# Patient Record
Sex: Female | Born: 1945 | Race: White | Hispanic: No | Marital: Married
Health system: Southern US, Community
[De-identification: ages and names within clinical notes are randomized; demographics above are authoritative.]

## PROBLEM LIST (undated history)

## (undated) DIAGNOSIS — H3530 Unspecified macular degeneration: Secondary | ICD-10-CM

## (undated) DIAGNOSIS — M069 Rheumatoid arthritis, unspecified: Secondary | ICD-10-CM

## (undated) DIAGNOSIS — E559 Vitamin D deficiency, unspecified: Secondary | ICD-10-CM

## (undated) DIAGNOSIS — I209 Angina pectoris, unspecified: Secondary | ICD-10-CM

## (undated) DIAGNOSIS — E785 Hyperlipidemia, unspecified: Secondary | ICD-10-CM

## (undated) DIAGNOSIS — I739 Peripheral vascular disease, unspecified: Secondary | ICD-10-CM

## (undated) DIAGNOSIS — C801 Malignant (primary) neoplasm, unspecified: Secondary | ICD-10-CM

## (undated) DIAGNOSIS — M1990 Unspecified osteoarthritis, unspecified site: Secondary | ICD-10-CM

## (undated) DIAGNOSIS — G8929 Other chronic pain: Secondary | ICD-10-CM

---
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
## 2007-06-19 ENCOUNTER — Other Ambulatory Visit: Payer: Self-pay

## 2007-06-19 ENCOUNTER — Inpatient Hospital Stay: Payer: Self-pay | Admitting: Internal Medicine

## 2007-06-25 ENCOUNTER — Emergency Department: Payer: Self-pay | Admitting: Emergency Medicine

## 2008-02-21 HISTORY — PX: JOINT REPLACEMENT: SHX530

## 2008-06-09 ENCOUNTER — Encounter: Payer: Self-pay | Admitting: Rheumatology

## 2008-06-20 ENCOUNTER — Encounter: Payer: Self-pay | Admitting: Rheumatology

## 2009-06-07 IMAGING — CR DG CHEST 2V
1 series · 2 of 2 positions shown · non-contrast
Comparison: none

REASON FOR EXAM: Chest pain
COMMENTS:

PROCEDURE:     DXR - DXR CHEST PA (OR AP) AND LATERAL  - June 19, 2007  [DATE]
RESULT:     The lungs are mildly hyperinflated. There is no focal
infiltrate. The heart is not enlarged. The pulmonary vascularity is not
engorged. Mild degenerative disc space narrowing of the thoracic spine is
noted.

[Series 1: view not recorded · 0.17mm/px · 2 of 2 slices shown]
[im 1/2]
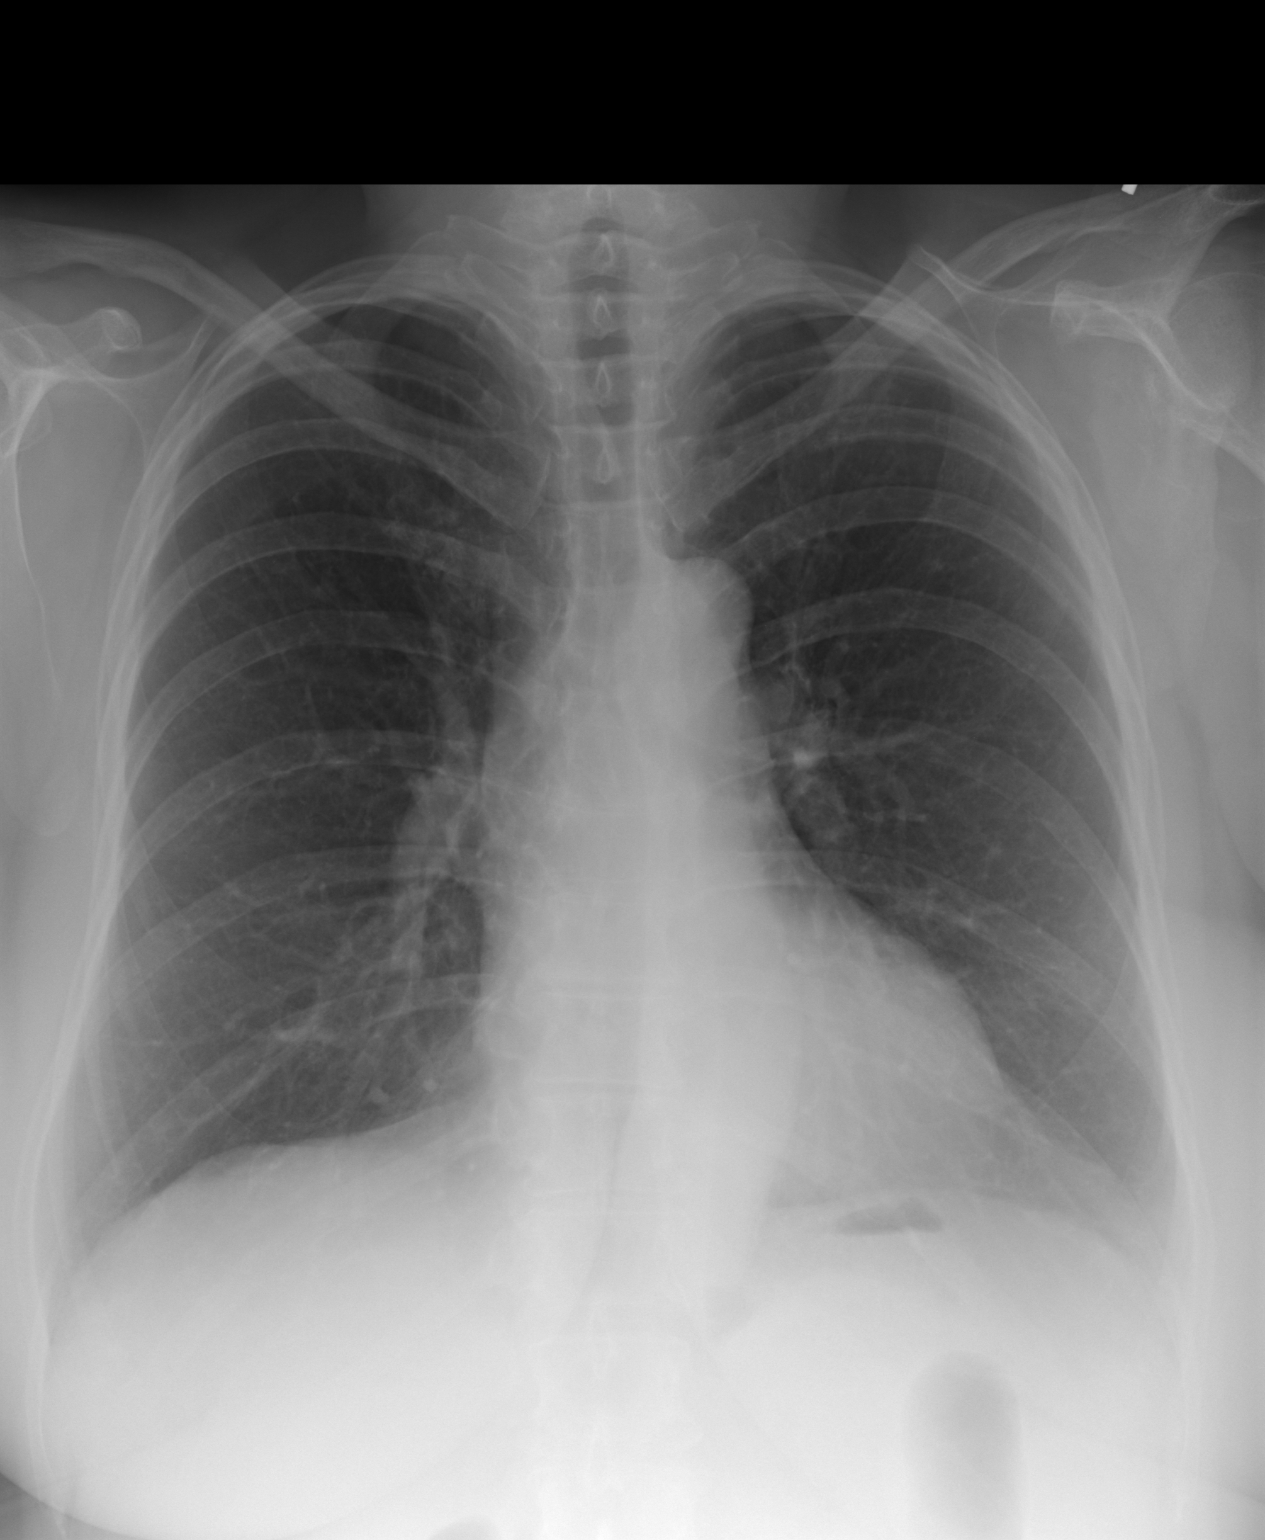
[im 2/2]
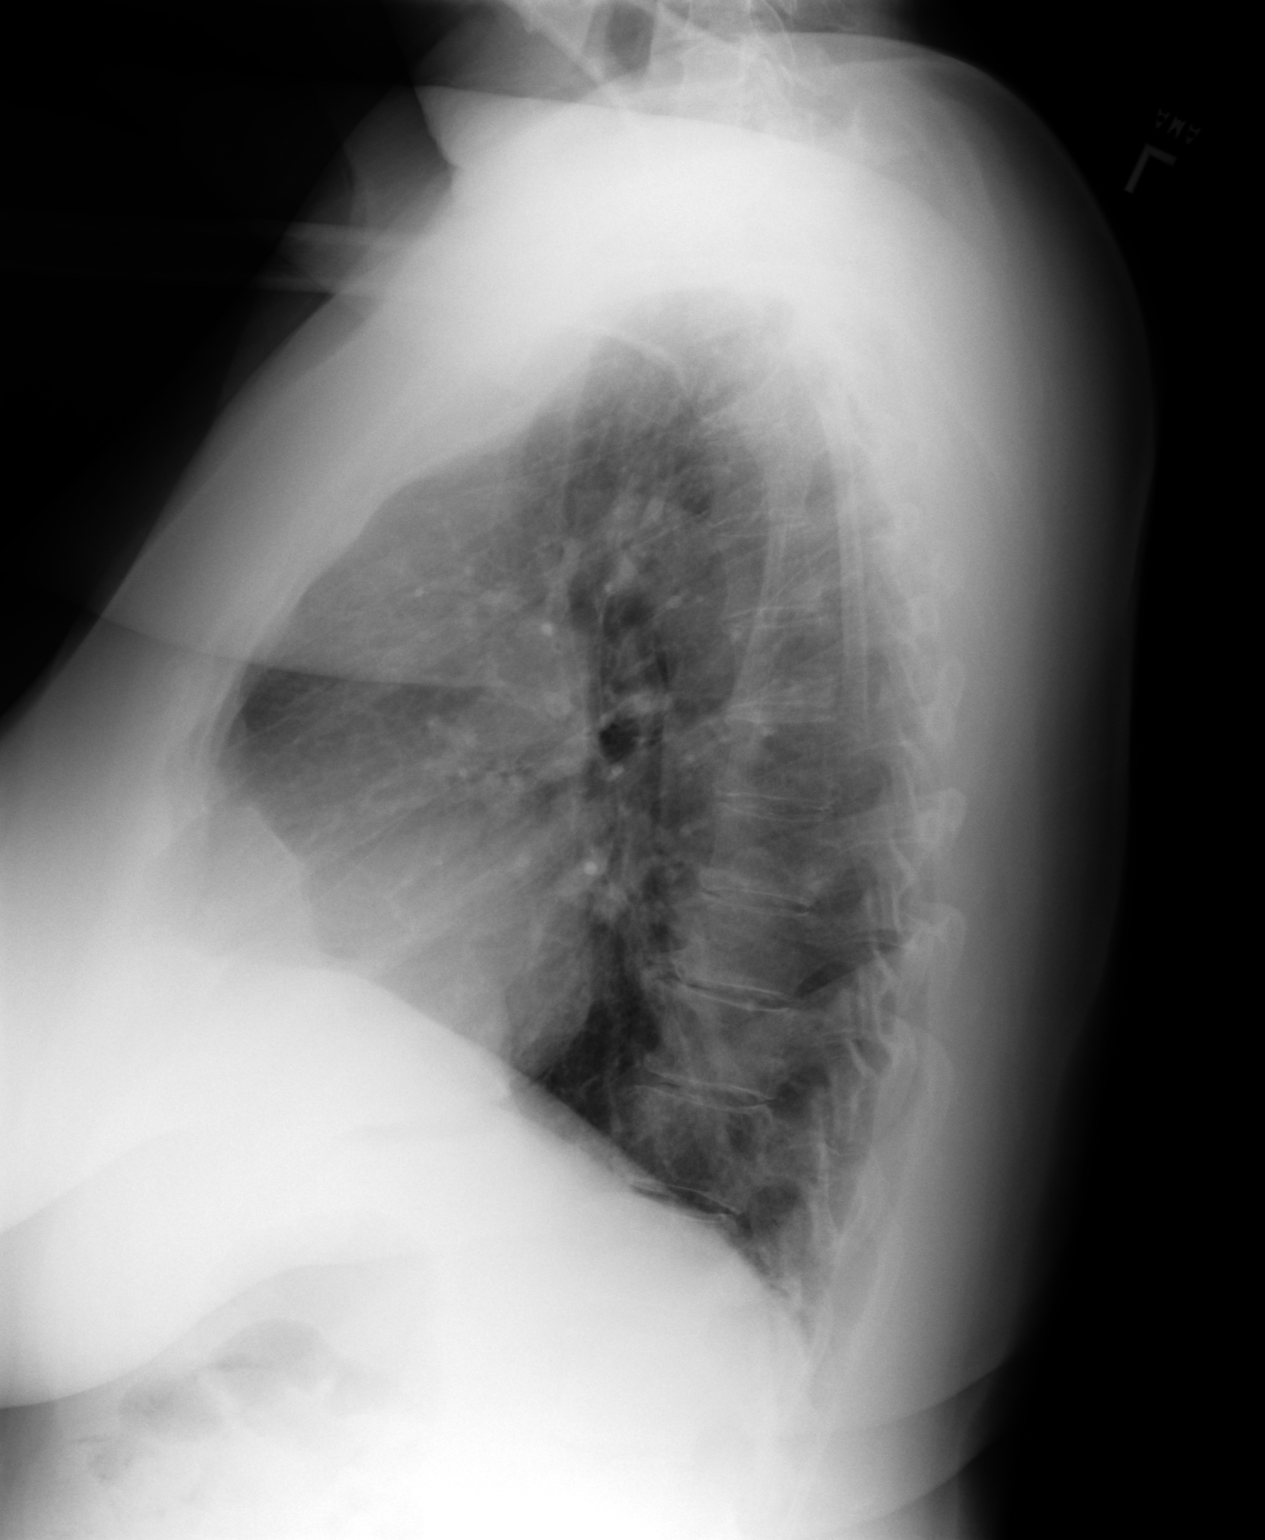

[2 of 2 positions shown; findings below may reference images not displayed]

IMPRESSION: I see no evidence of pneumonia. I cannot exclude mild
hyperinflation which may be voluntary or related to underlying reactive
airway disease or COPD. Follow-up films or CT scanning are recommended if
the patient's symptoms persist.

## 2013-06-30 DIAGNOSIS — H35342 Macular cyst, hole, or pseudohole, left eye: Secondary | ICD-10-CM

## 2013-06-30 HISTORY — DX: Macular cyst, hole, or pseudohole, left eye: H35.342

## 2013-07-21 ENCOUNTER — Ambulatory Visit: Payer: Self-pay | Admitting: Physician Assistant

## 2014-02-20 HISTORY — PX: HIP ARTHROPLASTY: SHX981

## 2014-02-20 HISTORY — PX: JOINT REPLACEMENT: SHX530

## 2014-04-15 DIAGNOSIS — M1611 Unilateral primary osteoarthritis, right hip: Secondary | ICD-10-CM | POA: Diagnosis not present

## 2014-04-16 DIAGNOSIS — Z01419 Encounter for gynecological examination (general) (routine) without abnormal findings: Secondary | ICD-10-CM | POA: Diagnosis not present

## 2014-04-16 DIAGNOSIS — M1611 Unilateral primary osteoarthritis, right hip: Secondary | ICD-10-CM | POA: Diagnosis not present

## 2014-04-16 DIAGNOSIS — Z1239 Encounter for other screening for malignant neoplasm of breast: Secondary | ICD-10-CM | POA: Diagnosis not present

## 2014-04-16 DIAGNOSIS — Z01818 Encounter for other preprocedural examination: Secondary | ICD-10-CM | POA: Diagnosis not present

## 2014-04-20 DIAGNOSIS — R0789 Other chest pain: Secondary | ICD-10-CM | POA: Diagnosis not present

## 2014-04-20 DIAGNOSIS — Z01818 Encounter for other preprocedural examination: Secondary | ICD-10-CM | POA: Diagnosis not present

## 2014-04-21 DIAGNOSIS — M169 Osteoarthritis of hip, unspecified: Secondary | ICD-10-CM | POA: Diagnosis not present

## 2014-04-21 DIAGNOSIS — E780 Pure hypercholesterolemia: Secondary | ICD-10-CM | POA: Diagnosis not present

## 2014-04-21 DIAGNOSIS — M1611 Unilateral primary osteoarthritis, right hip: Secondary | ICD-10-CM | POA: Diagnosis not present

## 2014-04-21 DIAGNOSIS — I447 Left bundle-branch block, unspecified: Secondary | ICD-10-CM | POA: Diagnosis not present

## 2014-04-29 DIAGNOSIS — M25651 Stiffness of right hip, not elsewhere classified: Secondary | ICD-10-CM | POA: Diagnosis not present

## 2014-04-29 DIAGNOSIS — R2689 Other abnormalities of gait and mobility: Secondary | ICD-10-CM | POA: Diagnosis not present

## 2014-04-29 DIAGNOSIS — M6281 Muscle weakness (generalized): Secondary | ICD-10-CM | POA: Diagnosis not present

## 2014-05-01 DIAGNOSIS — R2689 Other abnormalities of gait and mobility: Secondary | ICD-10-CM | POA: Diagnosis not present

## 2014-05-01 DIAGNOSIS — M25651 Stiffness of right hip, not elsewhere classified: Secondary | ICD-10-CM | POA: Diagnosis not present

## 2014-05-05 DIAGNOSIS — M6281 Muscle weakness (generalized): Secondary | ICD-10-CM | POA: Diagnosis not present

## 2014-05-05 DIAGNOSIS — R2689 Other abnormalities of gait and mobility: Secondary | ICD-10-CM | POA: Diagnosis not present

## 2014-05-05 DIAGNOSIS — M25651 Stiffness of right hip, not elsewhere classified: Secondary | ICD-10-CM | POA: Diagnosis not present

## 2014-05-08 DIAGNOSIS — R2689 Other abnormalities of gait and mobility: Secondary | ICD-10-CM | POA: Diagnosis not present

## 2014-05-08 DIAGNOSIS — M25651 Stiffness of right hip, not elsewhere classified: Secondary | ICD-10-CM | POA: Diagnosis not present

## 2014-05-12 DIAGNOSIS — R2689 Other abnormalities of gait and mobility: Secondary | ICD-10-CM | POA: Diagnosis not present

## 2014-05-12 DIAGNOSIS — M25651 Stiffness of right hip, not elsewhere classified: Secondary | ICD-10-CM | POA: Diagnosis not present

## 2014-05-15 DIAGNOSIS — M25651 Stiffness of right hip, not elsewhere classified: Secondary | ICD-10-CM | POA: Diagnosis not present

## 2014-05-15 DIAGNOSIS — R2689 Other abnormalities of gait and mobility: Secondary | ICD-10-CM | POA: Diagnosis not present

## 2014-05-22 DIAGNOSIS — R2689 Other abnormalities of gait and mobility: Secondary | ICD-10-CM | POA: Diagnosis not present

## 2014-05-22 DIAGNOSIS — M25651 Stiffness of right hip, not elsewhere classified: Secondary | ICD-10-CM | POA: Diagnosis not present

## 2014-05-25 DIAGNOSIS — R2689 Other abnormalities of gait and mobility: Secondary | ICD-10-CM | POA: Diagnosis not present

## 2014-05-25 DIAGNOSIS — M25651 Stiffness of right hip, not elsewhere classified: Secondary | ICD-10-CM | POA: Diagnosis not present

## 2014-05-26 DIAGNOSIS — H2510 Age-related nuclear cataract, unspecified eye: Secondary | ICD-10-CM | POA: Diagnosis not present

## 2014-05-28 DIAGNOSIS — R2689 Other abnormalities of gait and mobility: Secondary | ICD-10-CM | POA: Diagnosis not present

## 2014-05-28 DIAGNOSIS — M25651 Stiffness of right hip, not elsewhere classified: Secondary | ICD-10-CM | POA: Diagnosis not present

## 2014-06-01 DIAGNOSIS — Z96641 Presence of right artificial hip joint: Secondary | ICD-10-CM | POA: Diagnosis not present

## 2014-06-02 DIAGNOSIS — Z1231 Encounter for screening mammogram for malignant neoplasm of breast: Secondary | ICD-10-CM | POA: Diagnosis not present

## 2014-06-03 DIAGNOSIS — R2689 Other abnormalities of gait and mobility: Secondary | ICD-10-CM | POA: Diagnosis not present

## 2014-06-03 DIAGNOSIS — M25651 Stiffness of right hip, not elsewhere classified: Secondary | ICD-10-CM | POA: Diagnosis not present

## 2014-06-25 DIAGNOSIS — Z79899 Other long term (current) drug therapy: Secondary | ICD-10-CM | POA: Diagnosis not present

## 2014-06-25 DIAGNOSIS — E559 Vitamin D deficiency, unspecified: Secondary | ICD-10-CM | POA: Diagnosis not present

## 2014-06-25 DIAGNOSIS — M545 Low back pain: Secondary | ICD-10-CM | POA: Diagnosis not present

## 2014-06-25 DIAGNOSIS — E782 Mixed hyperlipidemia: Secondary | ICD-10-CM | POA: Diagnosis not present

## 2014-06-26 DIAGNOSIS — E782 Mixed hyperlipidemia: Secondary | ICD-10-CM | POA: Diagnosis not present

## 2014-06-26 DIAGNOSIS — Z79899 Other long term (current) drug therapy: Secondary | ICD-10-CM | POA: Diagnosis not present

## 2014-06-26 DIAGNOSIS — E559 Vitamin D deficiency, unspecified: Secondary | ICD-10-CM | POA: Diagnosis not present

## 2015-02-08 DIAGNOSIS — H2513 Age-related nuclear cataract, bilateral: Secondary | ICD-10-CM | POA: Diagnosis not present

## 2015-02-08 DIAGNOSIS — H2511 Age-related nuclear cataract, right eye: Secondary | ICD-10-CM | POA: Diagnosis not present

## 2015-02-09 DIAGNOSIS — H2512 Age-related nuclear cataract, left eye: Secondary | ICD-10-CM | POA: Diagnosis not present

## 2015-02-09 DIAGNOSIS — H2511 Age-related nuclear cataract, right eye: Secondary | ICD-10-CM | POA: Diagnosis not present

## 2015-02-10 DIAGNOSIS — H2511 Age-related nuclear cataract, right eye: Secondary | ICD-10-CM | POA: Diagnosis not present

## 2015-02-11 DIAGNOSIS — M25512 Pain in left shoulder: Secondary | ICD-10-CM | POA: Diagnosis not present

## 2015-02-11 DIAGNOSIS — M25511 Pain in right shoulder: Secondary | ICD-10-CM | POA: Diagnosis not present

## 2015-02-16 DIAGNOSIS — H2512 Age-related nuclear cataract, left eye: Secondary | ICD-10-CM | POA: Diagnosis not present

## 2015-02-16 DIAGNOSIS — H2511 Age-related nuclear cataract, right eye: Secondary | ICD-10-CM | POA: Diagnosis not present

## 2015-02-19 DIAGNOSIS — M25511 Pain in right shoulder: Secondary | ICD-10-CM | POA: Diagnosis not present

## 2015-02-25 DIAGNOSIS — M25511 Pain in right shoulder: Secondary | ICD-10-CM | POA: Diagnosis not present

## 2015-02-25 DIAGNOSIS — M13811 Other specified arthritis, right shoulder: Secondary | ICD-10-CM | POA: Diagnosis not present

## 2015-02-25 DIAGNOSIS — M19111 Post-traumatic osteoarthritis, right shoulder: Secondary | ICD-10-CM | POA: Diagnosis not present

## 2015-02-26 DIAGNOSIS — B070 Plantar wart: Secondary | ICD-10-CM | POA: Diagnosis not present

## 2015-02-26 DIAGNOSIS — E782 Mixed hyperlipidemia: Secondary | ICD-10-CM | POA: Diagnosis not present

## 2015-02-26 DIAGNOSIS — M6740 Ganglion, unspecified site: Secondary | ICD-10-CM | POA: Diagnosis not present

## 2015-03-10 DIAGNOSIS — M7662 Achilles tendinitis, left leg: Secondary | ICD-10-CM | POA: Diagnosis not present

## 2015-03-10 DIAGNOSIS — M79671 Pain in right foot: Secondary | ICD-10-CM | POA: Diagnosis not present

## 2015-03-10 DIAGNOSIS — S90851A Superficial foreign body, right foot, initial encounter: Secondary | ICD-10-CM | POA: Diagnosis not present

## 2015-03-10 DIAGNOSIS — M25572 Pain in left ankle and joints of left foot: Secondary | ICD-10-CM | POA: Diagnosis not present

## 2015-03-15 DIAGNOSIS — M7521 Bicipital tendinitis, right shoulder: Secondary | ICD-10-CM | POA: Diagnosis not present

## 2015-03-15 DIAGNOSIS — I9581 Postprocedural hypotension: Secondary | ICD-10-CM | POA: Diagnosis not present

## 2015-03-15 DIAGNOSIS — G8918 Other acute postprocedural pain: Secondary | ICD-10-CM | POA: Diagnosis not present

## 2015-03-15 DIAGNOSIS — M25511 Pain in right shoulder: Secondary | ICD-10-CM | POA: Diagnosis not present

## 2015-03-15 DIAGNOSIS — D72829 Elevated white blood cell count, unspecified: Secondary | ICD-10-CM | POA: Diagnosis not present

## 2015-03-15 DIAGNOSIS — M19211 Secondary osteoarthritis, right shoulder: Secondary | ICD-10-CM | POA: Diagnosis not present

## 2015-03-15 DIAGNOSIS — M75121 Complete rotator cuff tear or rupture of right shoulder, not specified as traumatic: Secondary | ICD-10-CM | POA: Diagnosis not present

## 2015-03-15 DIAGNOSIS — E785 Hyperlipidemia, unspecified: Secondary | ICD-10-CM | POA: Diagnosis not present

## 2015-03-25 DIAGNOSIS — M75121 Complete rotator cuff tear or rupture of right shoulder, not specified as traumatic: Secondary | ICD-10-CM | POA: Diagnosis not present

## 2015-03-25 DIAGNOSIS — M19211 Secondary osteoarthritis, right shoulder: Secondary | ICD-10-CM | POA: Diagnosis not present

## 2015-03-25 DIAGNOSIS — Z09 Encounter for follow-up examination after completed treatment for conditions other than malignant neoplasm: Secondary | ICD-10-CM | POA: Diagnosis not present

## 2015-04-12 DIAGNOSIS — Z79899 Other long term (current) drug therapy: Secondary | ICD-10-CM | POA: Diagnosis not present

## 2015-04-12 DIAGNOSIS — R600 Localized edema: Secondary | ICD-10-CM | POA: Diagnosis not present

## 2015-04-12 DIAGNOSIS — M545 Low back pain: Secondary | ICD-10-CM | POA: Diagnosis not present

## 2015-04-12 DIAGNOSIS — E559 Vitamin D deficiency, unspecified: Secondary | ICD-10-CM | POA: Diagnosis not present

## 2015-04-12 DIAGNOSIS — E782 Mixed hyperlipidemia: Secondary | ICD-10-CM | POA: Diagnosis not present

## 2015-04-14 DIAGNOSIS — E782 Mixed hyperlipidemia: Secondary | ICD-10-CM | POA: Diagnosis not present

## 2015-04-14 DIAGNOSIS — Z79899 Other long term (current) drug therapy: Secondary | ICD-10-CM | POA: Diagnosis not present

## 2015-04-14 DIAGNOSIS — E559 Vitamin D deficiency, unspecified: Secondary | ICD-10-CM | POA: Diagnosis not present

## 2015-04-14 DIAGNOSIS — H353132 Nonexudative age-related macular degeneration, bilateral, intermediate dry stage: Secondary | ICD-10-CM | POA: Diagnosis not present

## 2015-04-15 DIAGNOSIS — M25511 Pain in right shoulder: Secondary | ICD-10-CM | POA: Diagnosis not present

## 2015-04-21 DIAGNOSIS — M25511 Pain in right shoulder: Secondary | ICD-10-CM | POA: Diagnosis not present

## 2015-04-23 DIAGNOSIS — M25511 Pain in right shoulder: Secondary | ICD-10-CM | POA: Diagnosis not present

## 2015-04-28 DIAGNOSIS — M25511 Pain in right shoulder: Secondary | ICD-10-CM | POA: Diagnosis not present

## 2015-04-30 DIAGNOSIS — M25511 Pain in right shoulder: Secondary | ICD-10-CM | POA: Diagnosis not present

## 2015-05-07 DIAGNOSIS — M25511 Pain in right shoulder: Secondary | ICD-10-CM | POA: Diagnosis not present

## 2015-05-10 DIAGNOSIS — M25511 Pain in right shoulder: Secondary | ICD-10-CM | POA: Diagnosis not present

## 2015-05-12 DIAGNOSIS — M25511 Pain in right shoulder: Secondary | ICD-10-CM | POA: Diagnosis not present

## 2015-08-19 DIAGNOSIS — E559 Vitamin D deficiency, unspecified: Secondary | ICD-10-CM | POA: Diagnosis not present

## 2015-08-19 DIAGNOSIS — L853 Xerosis cutis: Secondary | ICD-10-CM | POA: Diagnosis not present

## 2015-08-19 DIAGNOSIS — R5383 Other fatigue: Secondary | ICD-10-CM | POA: Diagnosis not present

## 2015-08-19 DIAGNOSIS — K219 Gastro-esophageal reflux disease without esophagitis: Secondary | ICD-10-CM | POA: Diagnosis not present

## 2015-08-19 DIAGNOSIS — E782 Mixed hyperlipidemia: Secondary | ICD-10-CM | POA: Diagnosis not present

## 2015-09-16 DIAGNOSIS — Z1231 Encounter for screening mammogram for malignant neoplasm of breast: Secondary | ICD-10-CM | POA: Diagnosis not present

## 2015-09-20 DIAGNOSIS — B372 Candidiasis of skin and nail: Secondary | ICD-10-CM | POA: Diagnosis not present

## 2015-09-28 DIAGNOSIS — H5212 Myopia, left eye: Secondary | ICD-10-CM | POA: Diagnosis not present

## 2015-12-20 DIAGNOSIS — E782 Mixed hyperlipidemia: Secondary | ICD-10-CM | POA: Diagnosis not present

## 2015-12-20 DIAGNOSIS — R946 Abnormal results of thyroid function studies: Secondary | ICD-10-CM | POA: Diagnosis not present

## 2015-12-20 DIAGNOSIS — Z79899 Other long term (current) drug therapy: Secondary | ICD-10-CM | POA: Diagnosis not present

## 2015-12-20 DIAGNOSIS — E559 Vitamin D deficiency, unspecified: Secondary | ICD-10-CM | POA: Diagnosis not present

## 2015-12-20 DIAGNOSIS — Z23 Encounter for immunization: Secondary | ICD-10-CM | POA: Diagnosis not present

## 2016-01-03 DIAGNOSIS — Z780 Asymptomatic menopausal state: Secondary | ICD-10-CM | POA: Diagnosis not present

## 2016-02-18 ENCOUNTER — Other Ambulatory Visit: Payer: Self-pay | Admitting: Pharmacist

## 2016-02-18 NOTE — Patient Outreach (Signed)
Outreach call to [employer] regarding medication adherence to simvastatin. Brandy Sloan reports that she travels out of the country six months out of the year. Denies any barriers to taking her medication.  Patient reports that she has no further medication questions/concerns at this time.  Harlow Asa, PharmD, [facility] [phone]

## 2016-02-23 DIAGNOSIS — Z1211 Encounter for screening for malignant neoplasm of colon: Secondary | ICD-10-CM | POA: Diagnosis not present

## 2016-03-02 DIAGNOSIS — L821 Other seborrheic keratosis: Secondary | ICD-10-CM | POA: Diagnosis not present

## 2016-03-02 DIAGNOSIS — D2239 Melanocytic nevi of other parts of face: Secondary | ICD-10-CM | POA: Diagnosis not present

## 2016-03-02 DIAGNOSIS — L219 Seborrheic dermatitis, unspecified: Secondary | ICD-10-CM | POA: Diagnosis not present

## 2016-03-02 DIAGNOSIS — L820 Inflamed seborrheic keratosis: Secondary | ICD-10-CM | POA: Diagnosis not present

## 2016-03-02 DIAGNOSIS — I872 Venous insufficiency (chronic) (peripheral): Secondary | ICD-10-CM | POA: Diagnosis not present

## 2016-03-13 DIAGNOSIS — M25551 Pain in right hip: Secondary | ICD-10-CM | POA: Diagnosis not present

## 2016-03-13 DIAGNOSIS — M47816 Spondylosis without myelopathy or radiculopathy, lumbar region: Secondary | ICD-10-CM | POA: Diagnosis not present

## 2016-03-13 DIAGNOSIS — Z96641 Presence of right artificial hip joint: Secondary | ICD-10-CM | POA: Diagnosis not present

## 2016-03-16 DIAGNOSIS — M25551 Pain in right hip: Secondary | ICD-10-CM | POA: Diagnosis not present

## 2016-03-20 DIAGNOSIS — M25551 Pain in right hip: Secondary | ICD-10-CM | POA: Diagnosis not present

## 2016-03-23 DIAGNOSIS — M25551 Pain in right hip: Secondary | ICD-10-CM | POA: Diagnosis not present

## 2016-03-27 DIAGNOSIS — M25551 Pain in right hip: Secondary | ICD-10-CM | POA: Diagnosis not present

## 2016-03-30 DIAGNOSIS — M25551 Pain in right hip: Secondary | ICD-10-CM | POA: Diagnosis not present

## 2016-04-10 DIAGNOSIS — M5136 Other intervertebral disc degeneration, lumbar region: Secondary | ICD-10-CM | POA: Diagnosis not present

## 2016-04-10 DIAGNOSIS — Z96641 Presence of right artificial hip joint: Secondary | ICD-10-CM | POA: Diagnosis not present

## 2016-04-11 DIAGNOSIS — E782 Mixed hyperlipidemia: Secondary | ICD-10-CM | POA: Diagnosis not present

## 2016-04-11 DIAGNOSIS — R600 Localized edema: Secondary | ICD-10-CM | POA: Diagnosis not present

## 2016-04-11 DIAGNOSIS — R030 Elevated blood-pressure reading, without diagnosis of hypertension: Secondary | ICD-10-CM | POA: Diagnosis not present

## 2016-04-11 DIAGNOSIS — E559 Vitamin D deficiency, unspecified: Secondary | ICD-10-CM | POA: Diagnosis not present

## 2016-04-11 DIAGNOSIS — Z79899 Other long term (current) drug therapy: Secondary | ICD-10-CM | POA: Diagnosis not present

## 2016-04-19 DIAGNOSIS — M545 Low back pain: Secondary | ICD-10-CM | POA: Diagnosis not present

## 2016-04-21 DIAGNOSIS — M545 Low back pain: Secondary | ICD-10-CM | POA: Diagnosis not present

## 2016-04-26 DIAGNOSIS — H43813 Vitreous degeneration, bilateral: Secondary | ICD-10-CM | POA: Diagnosis not present

## 2016-04-26 DIAGNOSIS — M545 Low back pain: Secondary | ICD-10-CM | POA: Diagnosis not present

## 2016-04-28 DIAGNOSIS — M545 Low back pain: Secondary | ICD-10-CM | POA: Diagnosis not present

## 2016-05-01 DIAGNOSIS — M545 Low back pain: Secondary | ICD-10-CM | POA: Diagnosis not present

## 2016-05-04 DIAGNOSIS — M545 Low back pain: Secondary | ICD-10-CM | POA: Diagnosis not present

## 2017-01-15 DIAGNOSIS — M25561 Pain in right knee: Secondary | ICD-10-CM | POA: Diagnosis not present

## 2017-01-15 DIAGNOSIS — M25461 Effusion, right knee: Secondary | ICD-10-CM | POA: Diagnosis not present

## 2017-01-19 DIAGNOSIS — M25461 Effusion, right knee: Secondary | ICD-10-CM | POA: Diagnosis not present

## 2017-01-24 DIAGNOSIS — S83231A Complex tear of medial meniscus, current injury, right knee, initial encounter: Secondary | ICD-10-CM | POA: Diagnosis not present

## 2017-02-16 DIAGNOSIS — B029 Zoster without complications: Secondary | ICD-10-CM | POA: Diagnosis not present

## 2017-08-27 ENCOUNTER — Other Ambulatory Visit: Payer: Self-pay | Admitting: Internal Medicine

## 2017-08-27 DIAGNOSIS — Z1231 Encounter for screening mammogram for malignant neoplasm of breast: Secondary | ICD-10-CM

## 2018-03-18 ENCOUNTER — Encounter (INDEPENDENT_AMBULATORY_CARE_PROVIDER_SITE_OTHER): Payer: Self-pay

## 2018-03-18 ENCOUNTER — Ambulatory Visit
Admission: RE | Admit: 2018-03-18 | Discharge: 2018-03-18 | Disposition: A | Discharge status: Home / Self Care | Payer: Medicare HMO | Source: Ambulatory Visit | Attending: Internal Medicine | Admitting: Internal Medicine

## 2018-03-18 DIAGNOSIS — Z1231 Encounter for screening mammogram for malignant neoplasm of breast: Secondary | ICD-10-CM | POA: Diagnosis present

## 2018-03-18 HISTORY — DX: Malignant (primary) neoplasm, unspecified: C80.1

## 2019-10-15 LAB — COLOGUARD: COLOGUARD: POSITIVE — AB

## 2020-02-09 ENCOUNTER — Other Ambulatory Visit
Admission: RE | Admit: 2020-02-09 | Discharge: 2020-02-09 | Disposition: A | Discharge status: Home / Self Care | Payer: Medicare HMO | Source: Ambulatory Visit | Attending: Internal Medicine | Admitting: Internal Medicine

## 2020-02-09 ENCOUNTER — Other Ambulatory Visit: Payer: Self-pay

## 2020-02-09 DIAGNOSIS — Z01812 Encounter for preprocedural laboratory examination: Secondary | ICD-10-CM | POA: Diagnosis present

## 2020-02-09 DIAGNOSIS — Z20822 Contact with and (suspected) exposure to covid-19: Secondary | ICD-10-CM | POA: Diagnosis not present

## 2020-02-09 LAB — SARS CORONAVIRUS 2 (TAT 6-24 HRS): SARS Coronavirus 2: NEGATIVE

## 2020-02-10 ENCOUNTER — Encounter: Payer: Self-pay | Admitting: Internal Medicine

## 2020-02-11 ENCOUNTER — Encounter: Payer: Self-pay | Admitting: Internal Medicine

## 2020-02-11 ENCOUNTER — Ambulatory Visit: Payer: Medicare HMO | Admitting: Anesthesiology

## 2020-02-11 ENCOUNTER — Encounter
Admission: RE | Disposition: A | Discharge status: Home / Self Care | Payer: Self-pay | Source: Home / Self Care | Attending: Internal Medicine

## 2020-02-11 ENCOUNTER — Other Ambulatory Visit: Payer: Self-pay

## 2020-02-11 ENCOUNTER — Ambulatory Visit
Admission: RE | Admit: 2020-02-11 | Discharge: 2020-02-11 | Disposition: A | Discharge status: Home / Self Care | Payer: Medicare HMO | Attending: Internal Medicine | Admitting: Internal Medicine

## 2020-02-11 DIAGNOSIS — Z885 Allergy status to narcotic agent status: Secondary | ICD-10-CM | POA: Insufficient documentation

## 2020-02-11 DIAGNOSIS — D125 Benign neoplasm of sigmoid colon: Secondary | ICD-10-CM | POA: Diagnosis not present

## 2020-02-11 DIAGNOSIS — K640 First degree hemorrhoids: Secondary | ICD-10-CM | POA: Diagnosis not present

## 2020-02-11 DIAGNOSIS — H3530 Unspecified macular degeneration: Secondary | ICD-10-CM | POA: Diagnosis not present

## 2020-02-11 DIAGNOSIS — K5730 Diverticulosis of large intestine without perforation or abscess without bleeding: Secondary | ICD-10-CM | POA: Diagnosis not present

## 2020-02-11 DIAGNOSIS — D123 Benign neoplasm of transverse colon: Secondary | ICD-10-CM | POA: Diagnosis not present

## 2020-02-11 DIAGNOSIS — Z79899 Other long term (current) drug therapy: Secondary | ICD-10-CM | POA: Diagnosis not present

## 2020-02-11 DIAGNOSIS — D122 Benign neoplasm of ascending colon: Secondary | ICD-10-CM | POA: Insufficient documentation

## 2020-02-11 DIAGNOSIS — Z791 Long term (current) use of non-steroidal anti-inflammatories (NSAID): Secondary | ICD-10-CM | POA: Diagnosis not present

## 2020-02-11 DIAGNOSIS — R195 Other fecal abnormalities: Secondary | ICD-10-CM | POA: Diagnosis present

## 2020-02-11 HISTORY — PX: COLONOSCOPY WITH PROPOFOL: SHX5780

## 2020-02-11 HISTORY — DX: Hyperlipidemia, unspecified: E78.5

## 2020-02-11 HISTORY — DX: Unspecified macular degeneration: H35.30

## 2020-02-11 SURGERY — COLONOSCOPY WITH PROPOFOL
Anesthesia: General

## 2020-02-11 MED ORDER — GLYCOPYRROLATE 0.2 MG/ML IJ SOLN
INTRAMUSCULAR | Status: AC
  Filled 2020-02-11: qty 1

## 2020-02-11 MED ORDER — SODIUM CHLORIDE 0.9 % IV SOLN: INTRAVENOUS | Status: DC

## 2020-02-11 MED ORDER — PHENYLEPHRINE HCL (PRESSORS) 10 MG/ML IV SOLN
INTRAVENOUS | Status: DC | PRN
  Administered 2020-02-11: 100 ug via INTRAVENOUS

## 2020-02-11 MED ORDER — PROPOFOL 500 MG/50ML IV EMUL
INTRAVENOUS | Status: DC | PRN
  Administered 2020-02-11: 175 ug/kg/min via INTRAVENOUS

## 2020-02-11 MED ORDER — PROPOFOL 500 MG/50ML IV EMUL
INTRAVENOUS | Status: AC
  Filled 2020-02-11: qty 50

## 2020-02-11 MED ORDER — GLYCOPYRROLATE 0.2 MG/ML IJ SOLN
INTRAMUSCULAR | Status: DC | PRN
  Administered 2020-02-11: .2 mg via INTRAVENOUS

## 2020-02-11 MED ORDER — PROPOFOL 10 MG/ML IV BOLUS
INTRAVENOUS | Status: DC | PRN
  Administered 2020-02-11: 60 mg via INTRAVENOUS

## 2020-02-11 MED ORDER — LIDOCAINE HCL (PF) 2 % IJ SOLN
INTRAMUSCULAR | Status: AC
  Filled 2020-02-11: qty 5

## 2020-02-11 MED ORDER — LIDOCAINE HCL (CARDIAC) PF 100 MG/5ML IV SOSY
PREFILLED_SYRINGE | INTRAVENOUS | Status: DC | PRN
  Administered 2020-02-11: 50 mg via INTRAVENOUS

## 2020-02-11 NOTE — Anesthesia Preprocedure Evaluation (Signed)
Anesthesia Evaluation  Patient identified by MRN, date of birth, ID band Patient awake    Reviewed: Allergy & Precautions, NPO status , Patient's Chart, lab work & pertinent test results  Airway Mallampati: III  TM Distance: <3 FB     Dental  (+) Teeth Intact   Pulmonary neg pulmonary ROS,    Pulmonary exam normal        Cardiovascular negative cardio ROS Normal cardiovascular exam     Neuro/Psych negative neurological ROS  negative psych ROS   GI/Hepatic Neg liver ROS,   Endo/Other  negative endocrine ROS  Renal/GU negative Renal ROS  negative genitourinary   Musculoskeletal negative musculoskeletal ROS (+)   Abdominal Normal abdominal exam  (+)   Peds negative pediatric ROS (+)  Hematology negative hematology ROS (+)   Anesthesia Other Findings   Reproductive/Obstetrics                             Anesthesia Physical Anesthesia Plan  ASA: II  Anesthesia Plan: General   Post-op Pain Management:    Induction: Intravenous  PONV Risk Score and Plan:   Airway Management Planned: Nasal Cannula  Additional Equipment:   Intra-op Plan:   Post-operative Plan:   Informed Consent: I have reviewed the patients History and Physical, chart, labs and discussed the procedure including the risks, benefits and alternatives for the proposed anesthesia with the patient or authorized representative who has indicated his/her understanding and acceptance.     Dental advisory given  Plan Discussed with: CRNA and Surgeon  Anesthesia Plan Comments:         Anesthesia Quick Evaluation

## 2020-02-11 NOTE — Anesthesia Postprocedure Evaluation (Signed)
Anesthesia Post Note  Patient: Brandy Sloan  Procedure(s) Performed: COLONOSCOPY WITH PROPOFOL (N/A )  Patient location during evaluation: Endoscopy Anesthesia Type: General Level of consciousness: awake and alert and oriented Pain management: pain level controlled Vital Signs Assessment: post-procedure vital signs reviewed and stable Respiratory status: spontaneous breathing Cardiovascular status: blood pressure returned to baseline Anesthetic complications: no   No complications documented.   Last Vitals:  Vitals:   02/11/20 1110 02/11/20 1120  BP: 120/68 134/74  Pulse: 83 81  Resp: 16 18  Temp:    SpO2: 93% 98%    Last Pain:  Vitals:   02/11/20 1050  TempSrc: Temporal  PainSc:                  Corby Villasenor

## 2020-02-11 NOTE — Interval H&P Note (Signed)
History and Physical Interval Note:  02/11/2020 10:03 AM  Brandy Sloan  has presented today for surgery, with the diagnosis of positive cologuard test.  The various methods of treatment have been discussed with the patient and family. After consideration of risks, benefits and other options for treatment, the patient has consented to  Procedure(s): COLONOSCOPY WITH PROPOFOL (N/A) as a surgical intervention.  The patient's history has been reviewed, patient examined, no change in status, stable for surgery.  I have reviewed the patient's chart and labs.  Questions were answered to the patient's satisfaction.     [place], [place]

## 2020-02-11 NOTE — H&P (Signed)
Outpatient short stay form Pre-procedure 02/11/2020 10:02 AM Brallan Denio K. Alice Reichert, M.D.  Primary Physician: Ezequiel Kayser, M.D.  Reason for visit:  Positive fecal dna test  History of present illness:  Patient is a pleasant  74 y/o female/female presenting on referral from primary care provider for a POSITIVE Cologuard result. Patient denies change in bowel habits, rectal bleeding, weight loss or abdominal pain.        Current Facility-Administered Medications:  .  0.9 %  sodium chloride infusion, , Intravenous, Continuous, [place], Benay Pike, MD, Last Rate: 20 mL/hr at 02/11/20 0948, New Bag at 02/11/20 0948  Medications Prior to Admission  Medication Sig Dispense Refill Last Dose  . Cholecalciferol (VITAMIN D3) 1.25 MG (50000 UT) CAPS Take by mouth daily.     . diphenhydrAMINE-APAP, sleep, (EXCEDRIN PM PO) Take 1 tablet by mouth at bedtime.     . meloxicam (MOBIC) 15 MG tablet Take 15 mg by mouth daily.     . Multiple Vitamin (MULTIVITAMIN) tablet Take 1 tablet by mouth daily.     . Multiple Vitamins-Minerals (PRESERVISION AREDS 2+MULTI VIT) CAPS Take 1 capsule by mouth in the morning and at bedtime.     . simvastatin (ZOCOR) 20 MG tablet Take 20 mg by mouth daily.        Allergies  Allergen Reactions  . Codeine      Past Medical History:  Diagnosis Date  . Cancer ([place])    skin ca  . Hyperlipidemia   . Macular degeneration (senile) of retina     Review of systems:  Otherwise negative.    Physical Exam  Gen: Alert, oriented. Appears stated age.  HEENT: [place]/AT. PERRLA. Lungs: CTA, no wheezes. CV: RR nl S1, S2. Abd: soft, benign, no masses. BS+ Ext: No edema. Pulses 2+    Planned procedures: Proceed with colonoscopy. The patient understands the nature of the planned procedure, indications, risks, alternatives and potential complications including but not limited to bleeding, infection, perforation, damage to internal organs and possible oversedation/side effects from  anesthesia. The patient agrees and gives consent to proceed.  Please refer to procedure notes for findings, recommendations and patient disposition/instructions.     Piya Mesch K. Alice Reichert, M.D. Gastroenterology 02/11/2020  10:02 AM

## 2020-02-11 NOTE — Op Note (Signed)
[facility] Gastroenterology Patient Name: Brandy Sloan Procedure Date: 02/11/2020 10:05 AM MRN: [id number] Account #: [id number] Date of Birth: [birth year] Admit Type: Outpatient Age: 74 Room: [facility] ENDO ROOM 2 Gender: Female Note Status: Finalized Procedure:             Colonoscopy Indications:           Positive Cologuard test Providers:             Benay Pike. Alicyn Klann MD, MD Medicines:             Propofol per Anesthesia Complications:         No immediate complications. Procedure:             Pre-Anesthesia Assessment:                        - The risks and benefits of the procedure and the                         sedation options and risks were discussed with the                         patient. All questions were answered and informed                         consent was obtained.                        - Patient identification and proposed procedure were                         verified prior to the procedure by the nurse. The                         procedure was verified in the procedure room.                        - ASA Grade Assessment: III - A patient with severe                         systemic disease.                        - After reviewing the risks and benefits, the patient                         was deemed in satisfactory condition to undergo the                         procedure.                        After obtaining informed consent, the colonoscope was                         passed under direct vision. Throughout the procedure,                         the patient's blood pressure, pulse, and oxygen  saturations were monitored continuously. The                         Colonoscope was introduced through the anus and                         advanced to the the cecum, identified by appendiceal                         orifice and ileocecal valve. The colonoscopy was                         performed without difficulty. The  patient tolerated                         the procedure well. The quality of the bowel                         preparation was good. The ileocecal valve, appendiceal                         orifice, and rectum were photographed. Findings:      The perianal and digital rectal examinations were normal. Pertinent       negatives include normal sphincter tone and no palpable rectal lesions.      Non-bleeding internal hemorrhoids were found during retroflexion. The       hemorrhoids were Grade I (internal hemorrhoids that do not prolapse).      A few medium-mouthed diverticula were found in the sigmoid colon.      Two sessile polyps were found in the ascending colon. The polyps were 7       to 11 mm in size. These polyps were removed with a hot snare. Resection       and retrieval were complete. To prevent bleeding after the polypectomy,       two hemostatic clips were successfully placed (MR conditional). There       was no bleeding during, or at the end, of the procedure.      A 10 mm polyp was found in the transverse colon. The polyp was sessile.       The polyp was removed with a piecemeal technique using a hot snare.       Resection and retrieval were complete. To prevent bleeding after the       polypectomy, two hemostatic clips were successfully placed (MR       conditional). There was no bleeding during, or at the end, of the       procedure.      A 4 mm polyp was found in the sigmoid colon. The polyp was sessile. The       polyp was removed with a jumbo cold forceps. Resection and retrieval       were complete.      The exam was otherwise without abnormality. Impression:            - Non-bleeding internal hemorrhoids.                        - Diverticulosis in the sigmoid colon.                        -  Two 7 to 11 mm polyps in the ascending colon,                         removed with a hot snare. Resected and retrieved.                         Clips (MR conditional) were placed.                         - One 10 mm polyp in the transverse colon, removed                         piecemeal using a hot snare. Resected and retrieved.                         Clips (MR conditional) were placed.                        - One 4 mm polyp in the sigmoid colon, removed with a                         jumbo cold forceps. Resected and retrieved.                        - The examination was otherwise normal. Recommendation:        - Patient has a contact number available for                         emergencies. The signs and symptoms of potential                         delayed complications were discussed with the patient.                         Return to normal activities tomorrow. Written                         discharge instructions were provided to the patient.                        - Resume previous diet.                        - Continue present medications.                        - Repeat colonoscopy is recommended for surveillance.                         The colonoscopy date will be determined after                         pathology results from today's exam become available                         for review.                        - Return to GI office PRN.                        -  The findings and recommendations were discussed with                         the patient. Procedure Code(s):     --- Professional ---                        [phone], Colonoscopy, flexible; with removal of                         tumor(s), polyp(s), or other lesion(s) by snare                         technique                        45380, 66, Colonoscopy, flexible; with biopsy, single                         or multiple Diagnosis Code(s):     --- Professional ---                        K57.30, Diverticulosis of large intestine without                         perforation or abscess without bleeding                        R19.5, Other fecal abnormalities                        K63.5, Polyp of  colon                        K64.0, First degree hemorrhoids CPT copyright 2019 American Medical Association. All rights reserved. The codes documented in this report are preliminary and upon coder review may  be revised to meet current compliance requirements. Efrain Sella MD, MD 02/11/2020 10:54:06 AM This report has been signed electronically. Number of Addenda: 0 Note Initiated On: 02/11/2020 10:05 AM Scope Withdrawal Time: 0 hours 24 minutes 28 seconds  Total Procedure Duration: 0 hours 29 minutes 23 seconds  Estimated Blood Loss:  Estimated blood loss: none.      [facility]

## 2020-02-11 NOTE — Transfer of Care (Signed)
Immediate Anesthesia Transfer of Care Note  Patient: Brandy Sloan  Procedure(s) Performed: COLONOSCOPY WITH PROPOFOL (N/A )  Patient Location: PACU  Anesthesia Type:General  Level of Consciousness: sedated  Airway & Oxygen Therapy: Patient Spontanous Breathing  Post-op Assessment: Report given to RN and Post -op Vital signs reviewed and stable  Post vital signs: Reviewed and stable  Last Vitals:  Vitals Value Taken Time  BP 114/69 02/11/20 1054  Temp 37 C 02/11/20 1050  Pulse 85 02/11/20 1054  Resp 18 02/11/20 1054  SpO2 96 % 02/11/20 1054  Vitals shown include unvalidated device data.  Last Pain:  Vitals:   02/11/20 1050  TempSrc: Temporal  PainSc:          Complications: No complications documented.

## 2020-02-12 ENCOUNTER — Encounter: Payer: Self-pay | Admitting: Internal Medicine

## 2020-02-12 LAB — SURGICAL PATHOLOGY

## 2020-04-05 ENCOUNTER — Other Ambulatory Visit: Payer: Self-pay | Admitting: Internal Medicine

## 2020-04-05 DIAGNOSIS — Z1231 Encounter for screening mammogram for malignant neoplasm of breast: Secondary | ICD-10-CM

## 2020-04-22 ENCOUNTER — Other Ambulatory Visit: Payer: Self-pay

## 2020-04-22 ENCOUNTER — Ambulatory Visit
Admission: RE | Admit: 2020-04-22 | Discharge: 2020-04-22 | Disposition: A | Discharge status: Home / Self Care | Payer: Medicare HMO | Source: Ambulatory Visit | Attending: Internal Medicine | Admitting: Internal Medicine

## 2020-04-22 DIAGNOSIS — Z1231 Encounter for screening mammogram for malignant neoplasm of breast: Secondary | ICD-10-CM | POA: Diagnosis present

## 2021-05-03 ENCOUNTER — Encounter: Payer: Self-pay | Admitting: Internal Medicine

## 2021-05-04 ENCOUNTER — Encounter
Admission: RE | Disposition: A | Discharge status: Home / Self Care | Payer: Self-pay | Source: Home / Self Care | Attending: Internal Medicine

## 2021-05-04 ENCOUNTER — Ambulatory Visit
Admission: RE | Admit: 2021-05-04 | Discharge: 2021-05-04 | Disposition: A | Discharge status: Home / Self Care | Payer: Medicare HMO | Attending: Internal Medicine | Admitting: Internal Medicine

## 2021-05-04 ENCOUNTER — Ambulatory Visit: Payer: Medicare HMO | Admitting: Certified Registered"

## 2021-05-04 ENCOUNTER — Encounter: Payer: Self-pay | Admitting: Internal Medicine

## 2021-05-04 ENCOUNTER — Other Ambulatory Visit: Payer: Self-pay

## 2021-05-04 DIAGNOSIS — K640 First degree hemorrhoids: Secondary | ICD-10-CM | POA: Insufficient documentation

## 2021-05-04 DIAGNOSIS — K5730 Diverticulosis of large intestine without perforation or abscess without bleeding: Secondary | ICD-10-CM | POA: Diagnosis not present

## 2021-05-04 DIAGNOSIS — Z1211 Encounter for screening for malignant neoplasm of colon: Secondary | ICD-10-CM | POA: Diagnosis not present

## 2021-05-04 DIAGNOSIS — D125 Benign neoplasm of sigmoid colon: Secondary | ICD-10-CM | POA: Diagnosis not present

## 2021-05-04 HISTORY — DX: Unspecified osteoarthritis, unspecified site: M19.90

## 2021-05-04 HISTORY — DX: Rheumatoid arthritis, unspecified: M06.9

## 2021-05-04 HISTORY — DX: Angina pectoris, unspecified: I20.9

## 2021-05-04 HISTORY — DX: Vitamin D deficiency, unspecified: E55.9

## 2021-05-04 HISTORY — DX: Other chronic pain: G89.29

## 2021-05-04 HISTORY — PX: COLONOSCOPY WITH PROPOFOL: SHX5780

## 2021-05-04 HISTORY — DX: Peripheral vascular disease, unspecified: I73.9

## 2021-05-04 SURGERY — COLONOSCOPY WITH PROPOFOL
Anesthesia: General

## 2021-05-04 MED ORDER — LIDOCAINE HCL (CARDIAC) PF 100 MG/5ML IV SOSY
PREFILLED_SYRINGE | INTRAVENOUS | Status: DC | PRN
  Administered 2021-05-04: 80 mg via INTRAVENOUS
  Administered 2021-05-04: 20 mg via INTRAVENOUS

## 2021-05-04 MED ORDER — PHENYLEPHRINE 40 MCG/ML (10ML) SYRINGE FOR IV PUSH (FOR BLOOD PRESSURE SUPPORT)
PREFILLED_SYRINGE | INTRAVENOUS | Status: DC | PRN
  Administered 2021-05-04: 80 ug via INTRAVENOUS

## 2021-05-04 MED ORDER — EPHEDRINE SULFATE (PRESSORS) 50 MG/ML IJ SOLN
INTRAMUSCULAR | Status: DC | PRN
  Administered 2021-05-04: 5 mg via INTRAVENOUS

## 2021-05-04 MED ORDER — PROPOFOL 500 MG/50ML IV EMUL
INTRAVENOUS | Status: DC | PRN
  Administered 2021-05-04: 155 ug/kg/min via INTRAVENOUS

## 2021-05-04 MED ORDER — SODIUM CHLORIDE 0.9 % IV SOLN: INTRAVENOUS | Status: DC

## 2021-05-04 MED ORDER — PROPOFOL 10 MG/ML IV BOLUS
INTRAVENOUS | Status: DC | PRN
  Administered 2021-05-04: 10 mg via INTRAVENOUS
  Administered 2021-05-04: 40 mg via INTRAVENOUS

## 2021-05-04 NOTE — Anesthesia Preprocedure Evaluation (Signed)
Anesthesia Evaluation  ?Patient identified by MRN, date of birth, ID band ?Patient awake ? ? ? ?Reviewed: ?Allergy & Precautions, NPO status , Patient's Chart, lab work & pertinent test results ? ?History of Anesthesia Complications ?Negative for: history of anesthetic complications ? ?Airway ?Mallampati: III ? ?TM Distance: <3 FB ?Neck ROM: Full ? ? ? Dental ?no notable dental hx. ?(+) Teeth Intact ?  ?Pulmonary ?neg pulmonary ROS, neg sleep apnea, neg COPD, Patient abstained from smoking.Not current smoker,  ?  ?Pulmonary exam normal ?breath sounds clear to auscultation ? ? ? ? ? ? Cardiovascular ?Exercise Tolerance: Good ?METS(-) hypertension+ Peripheral Vascular Disease  ?(-) CAD and (-) Past MI Normal cardiovascular exam(-) dysrhythmias  ?Rhythm:Regular Rate:Normal ?- Systolic murmurs ? ?  ?Neuro/Psych ?negative neurological ROS ? negative psych ROS  ? GI/Hepatic ?Neg liver ROS, neg GERD  ,  ?Endo/Other  ?negative endocrine ROSneg diabetes ? Renal/GU ?negative Renal ROS  ?negative genitourinary ?  ?Musculoskeletal ? ?(+) Arthritis ,  ? Abdominal ?Normal abdominal exam  (+)   ?Peds ?negative pediatric ROS ?(+)  Hematology ?negative hematology ROS ?(+)   ?Anesthesia Other Findings ?Past Medical History: ?No date: Anginal pain ([place]) ?No date: Arthritis ?    Comment:  primary osteoarthritis of left hip, right knee ?No date: Cancer [facility]) ?    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 Reproductive/Obstetrics ? ?  ? ? ? ? ? ? ? ? ? ? ? ? ? ?  ?  ? ? ? ? ? ? ? ? ?Anesthesia Physical ? ?Anesthesia Plan ? ?ASA: 2 ? ?Anesthesia Plan: General  ? ?Post-op Pain Management: Minimal or no pain anticipated  ? ?Induction: Intravenous ? ?PONV  Risk Score and Plan: 3 and Propofol infusion, TIVA and Ondansetron ? ?Airway Management Planned: Nasal Cannula ? ?Additional Equipment: None ? ?Intra-op Plan:  ? ?Post-operative Plan:  ? ?Informed Consent: I have reviewed the patients History and Physical, chart, labs and discussed the procedure including the risks, benefits and alternatives for the proposed anesthesia with the patient or authorized representative who has indicated his/her understanding and acceptance.  ? ? ? ?Dental advisory given ? ?Plan Discussed with: CRNA and Surgeon ? ?Anesthesia Plan Comments: (Discussed risks of anesthesia with patient, including possibility of difficulty with spontaneous ventilation under anesthesia necessitating airway intervention, PONV, and rare risks such as cardiac or respiratory or neurological events, and allergic reactions. Discussed the role of CRNA in patient's perioperative care. Patient understands.)  ? ? ? ? ? ? ?Anesthesia Quick Evaluation ? ?

## 2021-05-04 NOTE — Anesthesia Procedure Notes (Signed)
Procedure Name: General with mask airway ?Date/Time: 05/04/2021 1:27 PM ?Performed by: Kelton Pillar, CRNA ?Pre-anesthesia Checklist: Patient identified, Emergency Drugs available, Suction available and Patient being monitored ?Patient Re-evaluated:Patient Re-evaluated prior to induction ?Oxygen Delivery Method: Simple face mask ?Induction Type: IV induction ?Placement Confirmation: positive ETCO2 and CO2 detector ?Dental Injury: Teeth and Oropharynx as per pre-operative assessment  ? ? ? ? ?

## 2021-05-04 NOTE — Op Note (Signed)
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 Alice Reichert MD, MD ?Referring MD:          Christena Flake. Raechel Ache, MD (Referring MD) ?Medicines:             Propofol per Anesthesia ?Complications:         No immediate complications. ?Procedure:             Pre-Anesthesia Assessment: ?                       - The risks and benefits of the procedure and the  ?                       sedation options and risks were discussed with the  ?                       patient. All questions were answered and informed  ?                       consent was obtained. ?                       - Patient identification and proposed procedure were  ?                       verified prior to the procedure by the nurse. The  ?                       procedure was verified in the procedure room. ?                       - ASA Grade Assessment: III - A patient with severe  ?                       systemic disease. ?                       - After reviewing the risks and benefits, the patient  ?                       was deemed in satisfactory condition to undergo the  ?                       procedure. ?                       After obtaining informed consent, the colonoscope was  ?  passed under direct vision. Throughout the procedure,  ?                       the patient's blood pressure, pulse, and oxygen  ?                       saturations were monitored continuously. The  ?                       Colonoscope was introduced through the anus and  ?                        advanced to the the cecum, identified by appendiceal  ?                       orifice and ileocecal valve. The colonoscopy was  ?                       performed without difficulty. The patient tolerated  ?                       the procedure well. The quality of the bowel  ?                       preparation was good. The ileocecal valve, appendiceal  ?                       orifice, and rectum were photographed. ?Findings: ?     The perianal and digital rectal examinations were normal. Pertinent  ?     negatives include normal sphincter tone and no palpable rectal lesions. ?     Many small and large-mouthed diverticula were found in the left colon.  ?     There was no evidence of diverticular bleeding. ?     A 12 mm post polypectomy scar was found in the transverse colon. There  ?     was residual polyp tissue. The polyp was removed with a jumbo cold  ?     forceps. Resection and retrieval were complete. ?     A 5 mm polyp was found in the sigmoid colon. The polyp was sessile. The  ?     polyp was removed with a jumbo cold forceps. Resection and retrieval  ?     were complete. ?     Non-bleeding internal hemorrhoids were found during retroflexion. The  ?     hemorrhoids were Grade I (internal hemorrhoids that do not prolapse). ?     The exam was otherwise without abnormality. ?Impression:            - Mild diverticulosis in the left colon. There was no  ?                       evidence of diverticular bleeding. ?                       - Post-polypectomy scar in the transverse colon. ?                       - One 5 mm polyp in the sigmoid colon, removed with a  ?  jumbo cold forceps. Resected and retrieved. ?                       - Non-bleeding internal hemorrhoids. ?                       - The examination was otherwise normal. ?Recommendation:        - Patient has a contact number available for  ?                       emergencies. The signs and symptoms of potential  ?                        delayed complications were discussed with the patient.  ?                       Return to normal activities tomorrow. Written  ?                       discharge instructions were provided to the patient. ?                       - Resume previous diet. ?                       - Continue present medications. ?                       - If polyps are benign or adenomatous without  ?                       dysplasia, I will advise NO further colonoscopy due to  ?                       advanced age and/or severe comorbidity. ?                       - Return to GI office PRN. ?                       - The findings and recommendations were discussed with  ?                       the patient. ?Procedure Code(s):     --- Professional --- ?                       [phone], Colonoscopy, flexible; with biopsy, single or  ?                       multiple ?Diagnosis Code(s):     --- Professional --- ?                       K57.30, Diverticulosis of large intestine without  ?                       perforation or abscess without bleeding ?                       K63.5, Polyp of colon ?  Z98.890, Other specified postprocedural states ?                       K64.0, First degree hemorrhoids ?                       Z86.010, Personal history of colonic polyps ?CPT copyright 2019 American Medical Association. All rights reserved. ?The codes documented in this report are preliminary and upon coder review may  ?be revised to meet current compliance requirements. ?Efrain Sella MD, MD ?05/04/2021 1:43:27 PM ?This report has been signed electronically. ?Number of Addenda: 0 ?Note Initiated On: 05/04/2021 1:06 PM ?Scope Withdrawal Time: 0 hours 5 minutes 50 seconds  ?Total Procedure Duration: 0 hours 10 minutes 29 seconds  ?Estimated Blood Loss:  Estimated blood loss: none. ?     [facility] ?

## 2021-05-04 NOTE — Interval H&P Note (Signed)
History and Physical Interval Note: ? ?05/04/2021 ?1:21 PM ? ?Brandy Sloan  has presented today for surgery, with the diagnosis of HX OF ADENMATOUS POLYPS OF COLON.  The various methods of treatment have been discussed with the patient and family. After consideration of risks, benefits and other options for treatment, the patient has consented to  Procedure(s): ?COLONOSCOPY WITH PROPOFOL (N/A) as a surgical intervention.  The patient's history has been reviewed, patient examined, no change in status, stable for surgery.  I have reviewed the patient's chart and labs.  Questions were answered to the patient's satisfaction.   ? ? ?[place], [place] ? ? ?

## 2021-05-04 NOTE — H&P (Signed)
Outpatient short stay form Pre-procedure ?05/04/2021 1:18 PM ?Lorie Apley K. Alice Reichert, M.D. ? ?Primary Physician: Ezequiel Kayser, M.D. ? ?Reason for visit:  Multiple colon polyps ? ?History of present illness:                            Patient presents for colonoscopy for a personal hx of colon polyps. The patient denies abdominal pain, abnormal weight loss or rectal bleeding.  ? ? ? ? ?Current Facility-Administered Medications:  ?  0.9 %  sodium chloride infusion, , Intravenous, Continuous, [place], Benay Pike, MD, Last Rate: 20 mL/hr at 05/04/21 1225, New Bag at 05/04/21 1225 ? ?Medications Prior to Admission  ?Medication Sig Dispense Refill Last Dose  ? aspirin EC 81 MG tablet Take 81 mg by mouth daily. Swallow whole.   05/02/2021  ? aspirin-acetaminophen-caffeine (EXCEDRIN MIGRAINE) 250-250-65 MG tablet Take by mouth every 6 (six) hours as needed for headache.     ? simvastatin (ZOCOR) 20 MG tablet Take 20 mg by mouth daily.   05/02/2021  ? Cholecalciferol (VITAMIN D3) 1.25 MG (50000 UT) CAPS Take by mouth daily.     ? diphenhydrAMINE-APAP, sleep, (EXCEDRIN PM PO) Take 1 tablet by mouth at bedtime.     ? meloxicam (MOBIC) 15 MG tablet Take 15 mg by mouth daily.     ? Multiple Vitamin (MULTIVITAMIN) tablet Take 1 tablet by mouth daily.     ? Multiple Vitamins-Minerals (PRESERVISION AREDS 2+MULTI VIT) CAPS Take 1 capsule by mouth in the morning and at bedtime.     ? ? ? ?Allergies  ?Allergen Reactions  ? Codeine   ? ? ? ?Past Medical History:  ?Diagnosis Date  ? Anginal pain ([place])   ? Arthritis   ? primary osteoarthritis of left hip, right knee  ? Cancer [facility])   ? skin ca  ? Chronic midline low back pain   ? Hyperlipidemia   ? 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 Macular degeneration (senile) of retina   ? PVD (peripheral vascular disease) ([place])   ? Rheumatoid arthritis ([place])   ? Vitamin D deficiency   ? ? ?Review of systems:  Otherwise negative.  ? ? ?Physical Exam ? ?Gen: Alert, oriented. Appears stated age.   ?HEENT: [place]/AT. PERRLA. ?Lungs: CTA, no wheezes. ?CV: RR nl S1, S2. ?Abd: soft, benign, no masses. BS+ ?Ext: No edema. Pulses 2+ ? ? ? ?Planned procedures: Proceed with colonoscopy. The patient understands the nature of the planned procedure, indications, risks, alternatives and potential complications including but not limited to bleeding, infection, perforation, damage to internal organs and possible oversedation/side effects from anesthesia. The patient agrees and gives consent to proceed.  ?Please refer to procedure notes for findings, recommendations and patient disposition/instructions.  ? ? ? ?Chaka Boyson K. Alice Reichert, M.D. ?Gastroenterology ?05/04/2021  1:18 PM ? ? ? ? ? ?

## 2021-05-04 NOTE — Anesthesia Postprocedure Evaluation (Signed)
Anesthesia Post Note ? ?Patient: Vernida Mcnicholas Kemmer ? ?Procedure(s) Performed: COLONOSCOPY WITH PROPOFOL ? ?Patient location during evaluation: Endoscopy ?Anesthesia Type: General ?Level of consciousness: awake and alert ?Pain management: pain level controlled ?Vital Signs Assessment: post-procedure vital signs reviewed and stable ?Respiratory status: spontaneous breathing, nonlabored ventilation, respiratory function stable and patient connected to nasal cannula oxygen ?Cardiovascular status: blood pressure returned to baseline and stable ?Postop Assessment: no apparent nausea or vomiting ?Anesthetic complications: no ? ? ?No notable events documented. ? ? ?Last Vitals:  ?Vitals:  ? 05/04/21 1340 05/04/21 1350  ?BP: (!) 93/54 (!) 97/55  ?Pulse: 87 87  ?Resp: 14 18  ?Temp: (!) 35.8 ?C   ?SpO2: 99% 96%  ?  ?Last Pain:  ?Vitals:  ? 05/04/21 1340  ?TempSrc: Temporal  ?PainSc: Asleep  ? ? ?  ?  ?  ?  ?  ?  ? ?Arita Miss ? ? ? ? ?

## 2021-05-04 NOTE — Transfer of Care (Signed)
Immediate Anesthesia Transfer of Care Note ? ?Patient: Brandy Sloan ? ?Procedure(s) Performed: COLONOSCOPY WITH PROPOFOL ? ?Patient Location: Endoscopy Unit ? ?Anesthesia Type:General ? ?Level of Consciousness: drowsy and patient cooperative ? ?Airway & Oxygen Therapy: Patient Spontanous Breathing and Patient connected to face mask oxygen ? ?Post-op Assessment: Report given to RN and Post -op Vital signs reviewed and stable ? ?Post vital signs: Reviewed and stable ? ?Last Vitals:  ?Vitals Value Taken Time  ?BP 93/54 05/04/21 1340  ?Temp 35.8 ?C 05/04/21 1340  ?Pulse 86 05/04/21 1342  ?Resp 17 05/04/21 1342  ?SpO2 99 % 05/04/21 1342  ?Vitals shown include unvalidated device data. ? ?Last Pain:  ?Vitals:  ? 05/04/21 1340  ?TempSrc: Temporal  ?PainSc: Asleep  ?   ? ?  ? ?Complications: No notable events documented. ?

## 2021-05-05 LAB — SURGICAL PATHOLOGY

## 2021-05-06 ENCOUNTER — Encounter: Payer: Self-pay | Admitting: Internal Medicine

## 2021-05-18 ENCOUNTER — Other Ambulatory Visit: Payer: Self-pay | Admitting: Internal Medicine

## 2021-05-18 DIAGNOSIS — Z1231 Encounter for screening mammogram for malignant neoplasm of breast: Secondary | ICD-10-CM

## 2021-07-04 ENCOUNTER — Ambulatory Visit
Admission: RE | Admit: 2021-07-04 | Discharge: 2021-07-04 | Disposition: A | Discharge status: Home / Self Care | Payer: Medicare HMO | Source: Ambulatory Visit | Attending: Internal Medicine | Admitting: Internal Medicine

## 2021-07-04 DIAGNOSIS — Z1231 Encounter for screening mammogram for malignant neoplasm of breast: Secondary | ICD-10-CM | POA: Insufficient documentation

## 2022-05-03 ENCOUNTER — Encounter: Payer: Self-pay | Admitting: Emergency Medicine

## 2022-05-03 DIAGNOSIS — T82838A Hemorrhage of vascular prosthetic devices, implants and grafts, initial encounter: Secondary | ICD-10-CM | POA: Diagnosis present

## 2022-05-03 DIAGNOSIS — Z7982 Long term (current) use of aspirin: Secondary | ICD-10-CM | POA: Insufficient documentation

## 2022-05-03 DIAGNOSIS — Z950 Presence of cardiac pacemaker: Secondary | ICD-10-CM | POA: Insufficient documentation

## 2022-05-03 DIAGNOSIS — Z5321 Procedure and treatment not carried out due to patient leaving prior to being seen by health care provider: Secondary | ICD-10-CM | POA: Insufficient documentation

## 2022-05-03 DIAGNOSIS — Y732 Prosthetic and other implants, materials and accessory gastroenterology and urology devices associated with adverse incidents: Secondary | ICD-10-CM | POA: Diagnosis not present

## 2022-05-03 NOTE — ED Triage Notes (Signed)
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 Pt had her leads changed today and noticed some bleeding around 45 mins ago. Bleeding well controlled with tegaderm and gauze. Sutures in situ; not on thinners; takes '81mg'$  ASA daily. A&Ox4 at this time. Denies CP or SOB.

## 2022-05-04 ENCOUNTER — Emergency Department
Admission: EM | Admit: 2022-05-04 | Discharge: 2022-05-04 | Discharge status: Against Medical Advice | Payer: Medicare HMO | Attending: Emergency Medicine | Admitting: Emergency Medicine

## 2022-06-15 ENCOUNTER — Other Ambulatory Visit: Payer: Self-pay | Admitting: Internal Medicine

## 2022-06-15 DIAGNOSIS — Z780 Asymptomatic menopausal state: Secondary | ICD-10-CM

## 2022-07-10 ENCOUNTER — Encounter: Payer: Self-pay | Admitting: Internal Medicine

## 2022-07-10 ENCOUNTER — Other Ambulatory Visit: Payer: Self-pay | Admitting: Internal Medicine

## 2022-07-10 DIAGNOSIS — Z1231 Encounter for screening mammogram for malignant neoplasm of breast: Secondary | ICD-10-CM

## 2022-08-01 ENCOUNTER — Ambulatory Visit
Admission: RE | Admit: 2022-08-01 | Discharge: 2022-08-01 | Disposition: A | Discharge status: Home / Self Care | Payer: Medicare HMO | Source: Ambulatory Visit | Attending: Internal Medicine | Admitting: Internal Medicine

## 2022-08-01 DIAGNOSIS — Z1231 Encounter for screening mammogram for malignant neoplasm of breast: Secondary | ICD-10-CM | POA: Diagnosis not present

## 2022-08-01 DIAGNOSIS — Z780 Asymptomatic menopausal state: Secondary | ICD-10-CM

## 2023-07-30 ENCOUNTER — Other Ambulatory Visit: Payer: Self-pay | Admitting: Internal Medicine

## 2023-07-30 DIAGNOSIS — Z1231 Encounter for screening mammogram for malignant neoplasm of breast: Secondary | ICD-10-CM

## 2023-09-06 ENCOUNTER — Ambulatory Visit
Admission: RE | Admit: 2023-09-06 | Discharge: 2023-09-06 | Disposition: A | Discharge status: Home / Self Care | Source: Ambulatory Visit | Attending: Internal Medicine | Admitting: Internal Medicine

## 2023-09-06 DIAGNOSIS — Z1231 Encounter for screening mammogram for malignant neoplasm of breast: Secondary | ICD-10-CM | POA: Diagnosis present

## 2023-11-21 ENCOUNTER — Other Ambulatory Visit: Payer: Self-pay | Admitting: Physician Assistant

## 2023-11-21 ENCOUNTER — Other Ambulatory Visit (HOSPITAL_COMMUNITY): Payer: Self-pay | Admitting: Physician Assistant

## 2023-11-21 DIAGNOSIS — M8440XA Pathological fracture, unspecified site, initial encounter for fracture: Secondary | ICD-10-CM

## 2023-11-22 ENCOUNTER — Ambulatory Visit (HOSPITAL_COMMUNITY)
Admission: RE | Admit: 2023-11-22 | Discharge: 2023-11-22 | Disposition: A | Discharge status: Home / Self Care | Source: Ambulatory Visit | Attending: Physician Assistant | Admitting: Physician Assistant

## 2023-11-22 ENCOUNTER — Other Ambulatory Visit (HOSPITAL_COMMUNITY): Payer: Self-pay | Admitting: Physician Assistant

## 2023-11-22 ENCOUNTER — Ambulatory Visit (HOSPITAL_COMMUNITY)
Admission: RE | Admit: 2023-11-22 | Discharge: 2023-11-22 | Disposition: A | Source: Ambulatory Visit | Attending: Physician Assistant

## 2023-11-22 DIAGNOSIS — M8440XA Pathological fracture, unspecified site, initial encounter for fracture: Secondary | ICD-10-CM | POA: Diagnosis present

## 2023-11-22 NOTE — Progress Notes (Signed)
 Patient was monitored by this RN during MRI scan due to presence of a pacemaker. Cardiac rhythm was continuously monitored throughout the procedure. Prior to the start of the scan, the pacemaker was placed in MRI-safe mode by the MRI technician and/or pacemaker representative. Following the completion of the scan, the device was returned to its pre-MRI settings. Neurological status and orientation post-procedure were unchanged from baseline.   Pre-procedure Heart Rate (Prior to being placed in MRI safe mode):  81  MRI mode: 110   Post-procedure Heart Rate (Once pacemaker is returned to baseline mode): 91

## 2023-11-22 NOTE — CV Procedure (Signed)
  Device system confirmed to be MRI conditional, with implant date > 6 weeks ago, and no evidence of abandoned or epicardial leads in review of most recent CXR  Device last cleared by EP Provider: Prentice Passey 11/22/23  Clearance is good through for 1 year as long as parameters remain stable at time of check. If pt undergoes a cardiac device procedure during that time, they should be re-cleared.   Tachy-therapies to be programmed off if applicable with device back to pre-MRI settings after completion of exam.  [employer] - Programming recommendation received through [employer] App/Tablet  Rocky Catalan, RT  11/22/2023 2:16 PM

## 2024-01-01 ENCOUNTER — Other Ambulatory Visit: Payer: Self-pay | Admitting: Internal Medicine

## 2024-01-01 DIAGNOSIS — N9489 Other specified conditions associated with female genital organs and menstrual cycle: Secondary | ICD-10-CM

## 2024-01-08 ENCOUNTER — Ambulatory Visit
Admission: RE | Admit: 2024-01-08 | Discharge: 2024-01-08 | Disposition: A | Discharge status: Home / Self Care | Source: Ambulatory Visit | Attending: Internal Medicine | Admitting: Internal Medicine

## 2024-01-08 DIAGNOSIS — N9489 Other specified conditions associated with female genital organs and menstrual cycle: Secondary | ICD-10-CM | POA: Insufficient documentation
